# Patient Record
Sex: Male | Born: 2000 | Race: White | Hispanic: No | Marital: Single | Smoking: Never smoker
Health system: Southern US, Community
[De-identification: ages and names within clinical notes are randomized; demographics above are authoritative.]

---
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
## 2021-04-01 ENCOUNTER — Encounter (HOSPITAL_BASED_OUTPATIENT_CLINIC_OR_DEPARTMENT_OTHER): Payer: Self-pay | Admitting: Emergency Medicine

## 2021-04-01 ENCOUNTER — Other Ambulatory Visit: Payer: Self-pay

## 2021-04-01 ENCOUNTER — Emergency Department (HOSPITAL_BASED_OUTPATIENT_CLINIC_OR_DEPARTMENT_OTHER)
Admission: EM | Admit: 2021-04-01 | Discharge: 2021-04-01 | Disposition: A | Discharge status: Home / Self Care | Payer: No Typology Code available for payment source | Attending: Emergency Medicine | Admitting: Emergency Medicine

## 2021-04-01 DIAGNOSIS — W228XXA Striking against or struck by other objects, initial encounter: Secondary | ICD-10-CM | POA: Insufficient documentation

## 2021-04-01 DIAGNOSIS — S0101XA Laceration without foreign body of scalp, initial encounter: Secondary | ICD-10-CM | POA: Insufficient documentation

## 2021-04-01 DIAGNOSIS — S0990XA Unspecified injury of head, initial encounter: Secondary | ICD-10-CM | POA: Diagnosis present

## 2021-04-01 MED ORDER — LIDOCAINE-EPINEPHRINE (PF) 2 %-1:200000 IJ SOLN
10.0000 mL | Freq: Once | INTRAMUSCULAR | Status: AC
  Administered 2021-04-01: 10 mL via INTRADERMAL
  Filled 2021-04-01: qty 20

## 2021-04-01 NOTE — ED Triage Notes (Signed)
Pt with lac to top of his head. He was stepping into the ambulance and hit his head on the lock on the top of the door. No LOC.

## 2021-04-01 NOTE — ED Provider Notes (Signed)
[facility] EMERGENCY DEPT Provider Note   CSN: [id number] Arrival date & time: 04/01/21  1025     History Chief Complaint  Patient presents with   Laceration    Dylan Wells is a 20 y.o. male.  HPI  20 year old male presents to the emergency department today for evaluation of a laceration of the head.  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  He somehow hit his head on part of the truck and sustained a laceration.  He denies loss of consciousness.  Bleeding is controlled at this time.  Denies any associated dizziness, lightheadedness, vomiting or other associated neurologic complaints.  He denies any pain elsewhere.  He is not anticoagulated.  His Tdap was updated 6 months ago.  History reviewed. No pertinent past medical history.  There are no problems to display for this patient.   History reviewed. No pertinent surgical history.     No family history on file.  Social History   Tobacco Use   Smoking status: Never   Smokeless tobacco: Never  Substance Use Topics   Alcohol use: Never   Drug use: Never    Home Medications Prior to Admission medications   Not on File    Allergies    Patient has no known allergies.  Review of Systems   Review of Systems  Constitutional:  Negative for fever.  Gastrointestinal:  Negative for nausea and vomiting.  Musculoskeletal:  Negative for back pain and neck pain.  Skin:  Positive for wound.  Neurological:  Negative for weakness and numbness.       Head injury no loc   Physical Exam Updated Vital Signs BP 129/75    Pulse 83    Temp 98.1 F (36.7 C)    Resp 16    Ht 6\' 1"  (1.854 m)    Wt 83.9 kg    SpO2 96%    BMI 24.41 kg/m   Physical Exam Constitutional:      General: He is not in acute distress.    Appearance: He is well-developed.  HENT:     Head:     Comments: 2 cm laceration to the scalp. No active bleeding.  Eyes:     Conjunctiva/sclera:  Conjunctivae normal.  Cardiovascular:     Rate and Rhythm: Normal rate.  Pulmonary:     Effort: Pulmonary effort is normal.     Breath sounds: Normal breath sounds.  Musculoskeletal:     Comments: No midline ttp.   Skin:    General: Skin is warm and dry.  Neurological:     Mental Status: He is alert and oriented to person, place, and time.     Comments: Mental Status:  Alert, thought content appropriate, able to give a coherent history. Speech fluent without evidence of aphasia. Able to follow 2 step commands without difficulty.  Cranial Nerves: II-XII intact Motor:  Normal tone. 5/5 strength of BUE and BLE major muscle groups including strong and equal grip strength and dorsiflexion/plantar flexion Sensory: light touch normal in all extremities.     ED Results / Procedures / Treatments   Labs (all labs ordered are listed, but only abnormal results are displayed) Labs Reviewed - No data to display  EKG None  Radiology No results found.  Procedures . Laceration Repair  Date/Time: 04/01/2021 1:12 PM Performed by: 04/03/2021, PA-C Authorized by: Karrie Meres, PA-C   Consent:    Consent obtained:  Verbal  Consent given by:  Patient   Risks, benefits, and alternatives were discussed: yes     Risks discussed:  Infection and pain   Alternatives discussed:  No treatment Universal protocol:    Procedure explained and questions answered to patient or proxy's satisfaction: yes     Patient identity confirmed:  Verbally with patient Anesthesia:    Anesthesia method:  Local infiltration   Local anesthetic:  Lidocaine 2% WITH epi Laceration details:    Location:  Scalp   Scalp location:  Crown   Length (cm):  2 Pre-procedure details:    Preparation:  Patient was prepped and draped in usual sterile fashion Exploration:    Limited defect created (wound extended): no     Hemostasis achieved with:  Direct pressure and epinephrine   Wound exploration: wound  explored through full range of motion and entire depth of wound visualized     Contaminated: no   Treatment:    Area cleansed with:  Povidone-iodine   Amount of cleaning:  Standard   Visualized foreign bodies/material removed: no     Debridement:  None   Undermining:  None   Scar revision: no   Skin repair:    Repair method:  Staples   Number of staples:  4 Repair type:    Repair type:  Simple Post-procedure details:    Dressing:  Open (no dressing)   Procedure completion:  Tolerated   Medications Ordered in ED Medications  lidocaine-EPINEPHrine (XYLOCAINE W/EPI) 2 %-1:200000 (PF) injection 10 mL (10 mLs Intradermal Given by Other 04/01/21 1241)    ED Course  I have reviewed the triage vital signs and the nursing notes.  Pertinent labs & imaging results that were available during my care of the patient were reviewed by me and considered in my medical decision making (see chart for details).    MDM Rules/Calculators/A&P                          20 y/o M presents for eval of laceration   Wound explored and base of wound visualized in a bloodless field without evidence of foreign body.  Laceration occurred < 8 hours prior to repair which was well tolerated. Tdap UTD. Pt has  no comorbidities to effect normal wound healing. Pt discharged without antibiotics.  Discussed suture home care with patient and answered questions. Pt to follow-up for wound check and staple removal in 5 days; they are to return to the ED sooner for signs of infection. Pt is hemodynamically stable with no complaints prior to dc.   Final Clinical Impression(s) / ED Diagnoses Final diagnoses:  Laceration of scalp without foreign body, initial encounter    Rx / DC Orders ED Discharge Orders     None        Rayne Du 04/01/21 1313    Linwood Dibbles, MD 04/02/21 1454

## 2021-04-01 NOTE — Discharge Instructions (Signed)
Please follow-up for suture removal at either urgent care ,the emergency department, or your primary care doctor in 5 days. ° °Please return to the emergency room immediately if you experience any new or worsening symptoms or any symptoms that indicate worsening infection such as fevers, increased redness/swelling/pain, warmth, or drainage from the affected area.  ° °
